# Patient Record
Sex: Female | Born: 1960 | Hispanic: No | Marital: Single
Health system: Southern US, Community
[De-identification: ages and names within clinical notes are randomized; demographics above are authoritative.]

---

## 1999-01-13 ENCOUNTER — Other Ambulatory Visit: Admission: RE | Admit: 1999-01-13 | Discharge: 1999-01-13 | Payer: Self-pay | Admitting: Gynecology

## 2000-02-24 ENCOUNTER — Other Ambulatory Visit: Admission: RE | Admit: 2000-02-24 | Discharge: 2000-02-24 | Payer: Self-pay | Admitting: Gynecology

## 2001-04-26 ENCOUNTER — Other Ambulatory Visit: Admission: RE | Admit: 2001-04-26 | Discharge: 2001-04-26 | Payer: Self-pay | Admitting: Gynecology

## 2002-06-01 ENCOUNTER — Other Ambulatory Visit: Admission: RE | Admit: 2002-06-01 | Discharge: 2002-06-01 | Payer: Self-pay | Admitting: Gynecology

## 2003-06-11 ENCOUNTER — Other Ambulatory Visit: Admission: RE | Admit: 2003-06-11 | Discharge: 2003-06-11 | Payer: Self-pay | Admitting: Gynecology

## 2004-06-02 ENCOUNTER — Other Ambulatory Visit: Admission: RE | Admit: 2004-06-02 | Discharge: 2004-06-02 | Payer: Self-pay | Admitting: Gynecology

## 2005-06-16 ENCOUNTER — Other Ambulatory Visit: Admission: RE | Admit: 2005-06-16 | Discharge: 2005-06-16 | Payer: Self-pay | Admitting: Gynecology

## 2006-06-15 ENCOUNTER — Other Ambulatory Visit: Admission: RE | Admit: 2006-06-15 | Discharge: 2006-06-15 | Payer: Self-pay | Admitting: Gynecology

## 2007-12-01 ENCOUNTER — Other Ambulatory Visit: Admission: RE | Admit: 2007-12-01 | Discharge: 2007-12-01 | Payer: Self-pay | Admitting: Gynecology

## 2011-05-21 ENCOUNTER — Other Ambulatory Visit: Payer: Self-pay | Admitting: Gynecology

## 2011-05-21 DIAGNOSIS — R928 Other abnormal and inconclusive findings on diagnostic imaging of breast: Secondary | ICD-10-CM

## 2011-05-29 ENCOUNTER — Ambulatory Visit
Admission: RE | Admit: 2011-05-29 | Discharge: 2011-05-29 | Disposition: A | Discharge status: Home / Self Care | Payer: 59 | Source: Ambulatory Visit | Attending: Gynecology | Admitting: Gynecology

## 2011-05-29 DIAGNOSIS — R928 Other abnormal and inconclusive findings on diagnostic imaging of breast: Secondary | ICD-10-CM

## 2011-12-16 ENCOUNTER — Other Ambulatory Visit: Payer: Self-pay | Admitting: Gynecology

## 2011-12-16 DIAGNOSIS — N63 Unspecified lump in unspecified breast: Secondary | ICD-10-CM

## 2011-12-25 ENCOUNTER — Ambulatory Visit
Admission: RE | Admit: 2011-12-25 | Discharge: 2011-12-25 | Disposition: A | Discharge status: Home / Self Care | Payer: 59 | Source: Ambulatory Visit | Attending: Gynecology | Admitting: Gynecology

## 2011-12-25 ENCOUNTER — Other Ambulatory Visit: Payer: Self-pay | Admitting: Gynecology

## 2011-12-25 DIAGNOSIS — N63 Unspecified lump in unspecified breast: Secondary | ICD-10-CM

## 2012-07-18 ENCOUNTER — Other Ambulatory Visit: Payer: Self-pay | Admitting: Gynecology

## 2012-07-18 DIAGNOSIS — N63 Unspecified lump in unspecified breast: Secondary | ICD-10-CM

## 2012-08-22 ENCOUNTER — Ambulatory Visit
Admission: RE | Admit: 2012-08-22 | Discharge: 2012-08-22 | Disposition: A | Discharge status: Home / Self Care | Payer: 59 | Source: Ambulatory Visit | Attending: Gynecology | Admitting: Gynecology

## 2012-08-22 DIAGNOSIS — N63 Unspecified lump in unspecified breast: Secondary | ICD-10-CM

## 2013-10-31 ENCOUNTER — Other Ambulatory Visit: Payer: Self-pay | Admitting: Obstetrics & Gynecology

## 2013-10-31 DIAGNOSIS — N63 Unspecified lump in unspecified breast: Secondary | ICD-10-CM

## 2013-11-17 ENCOUNTER — Other Ambulatory Visit: Payer: 59

## 2013-12-01 ENCOUNTER — Other Ambulatory Visit: Payer: 59

## 2013-12-11 ENCOUNTER — Other Ambulatory Visit: Payer: 59

## 2013-12-12 ENCOUNTER — Other Ambulatory Visit: Payer: 59

## 2013-12-25 ENCOUNTER — Ambulatory Visit
Admission: RE | Admit: 2013-12-25 | Discharge: 2013-12-25 | Disposition: A | Discharge status: Home / Self Care | Payer: Commercial Managed Care - PPO | Source: Ambulatory Visit | Attending: Obstetrics & Gynecology | Admitting: Obstetrics & Gynecology

## 2013-12-25 DIAGNOSIS — N63 Unspecified lump in unspecified breast: Secondary | ICD-10-CM

## 2015-09-06 ENCOUNTER — Other Ambulatory Visit: Payer: Self-pay

## 2015-09-06 DIAGNOSIS — Z1231 Encounter for screening mammogram for malignant neoplasm of breast: Secondary | ICD-10-CM

## 2015-10-03 ENCOUNTER — Ambulatory Visit: Payer: Commercial Managed Care - PPO

## 2015-11-13 ENCOUNTER — Ambulatory Visit
Admission: RE | Admit: 2015-11-13 | Discharge: 2015-11-13 | Disposition: A | Discharge status: Home / Self Care | Payer: Commercial Managed Care - PPO | Source: Ambulatory Visit

## 2015-11-13 DIAGNOSIS — Z1231 Encounter for screening mammogram for malignant neoplasm of breast: Secondary | ICD-10-CM

## 2017-08-23 ENCOUNTER — Other Ambulatory Visit: Payer: Self-pay | Admitting: Obstetrics & Gynecology

## 2017-08-23 ENCOUNTER — Other Ambulatory Visit: Payer: Self-pay | Admitting: Family Medicine

## 2017-08-23 DIAGNOSIS — Z1231 Encounter for screening mammogram for malignant neoplasm of breast: Secondary | ICD-10-CM

## 2017-10-04 ENCOUNTER — Ambulatory Visit: Payer: Commercial Managed Care - PPO

## 2017-11-08 ENCOUNTER — Ambulatory Visit: Payer: Commercial Managed Care - PPO

## 2017-11-22 ENCOUNTER — Ambulatory Visit
Admission: RE | Admit: 2017-11-22 | Discharge: 2017-11-22 | Disposition: A | Discharge status: Home / Self Care | Payer: Commercial Managed Care - PPO | Source: Ambulatory Visit | Attending: Family Medicine | Admitting: Family Medicine

## 2017-11-22 DIAGNOSIS — Z1231 Encounter for screening mammogram for malignant neoplasm of breast: Secondary | ICD-10-CM

## 2019-12-11 ENCOUNTER — Other Ambulatory Visit: Payer: Self-pay | Admitting: Family Medicine

## 2019-12-11 DIAGNOSIS — Z1231 Encounter for screening mammogram for malignant neoplasm of breast: Secondary | ICD-10-CM

## 2020-01-17 ENCOUNTER — Ambulatory Visit
Admission: RE | Admit: 2020-01-17 | Discharge: 2020-01-17 | Disposition: A | Discharge status: Home / Self Care | Payer: Commercial Managed Care - PPO | Source: Ambulatory Visit | Attending: Family Medicine | Admitting: Family Medicine

## 2020-01-17 ENCOUNTER — Other Ambulatory Visit: Payer: Self-pay

## 2020-01-17 DIAGNOSIS — Z1231 Encounter for screening mammogram for malignant neoplasm of breast: Secondary | ICD-10-CM

## 2021-10-01 ENCOUNTER — Other Ambulatory Visit: Payer: Self-pay | Admitting: Family Medicine

## 2021-10-01 DIAGNOSIS — Z1231 Encounter for screening mammogram for malignant neoplasm of breast: Secondary | ICD-10-CM

## 2021-11-07 ENCOUNTER — Ambulatory Visit
Admission: RE | Admit: 2021-11-07 | Discharge: 2021-11-07 | Disposition: A | Discharge status: Home / Self Care | Payer: Commercial Managed Care - PPO | Source: Ambulatory Visit | Attending: Family Medicine | Admitting: Family Medicine

## 2021-11-07 DIAGNOSIS — Z1231 Encounter for screening mammogram for malignant neoplasm of breast: Secondary | ICD-10-CM

## 2022-02-28 IMAGING — MG MM DIGITAL SCREENING BILAT W/ TOMO AND CAD
8 series · 9 of 24 positions shown · non-contrast
Comparison: Previous exam(s).

CLINICAL DATA: Screening.

EXAM:
DIGITAL SCREENING BILATERAL MAMMOGRAM WITH TOMOSYNTHESIS AND CAD
TECHNIQUE: Bilateral screening digital craniocaudal and mediolateral oblique
mammograms were obtained. Bilateral screening digital breast
tomosynthesis was performed. The images were evaluated with
computer-aided detection.

[L CC synth-2D]
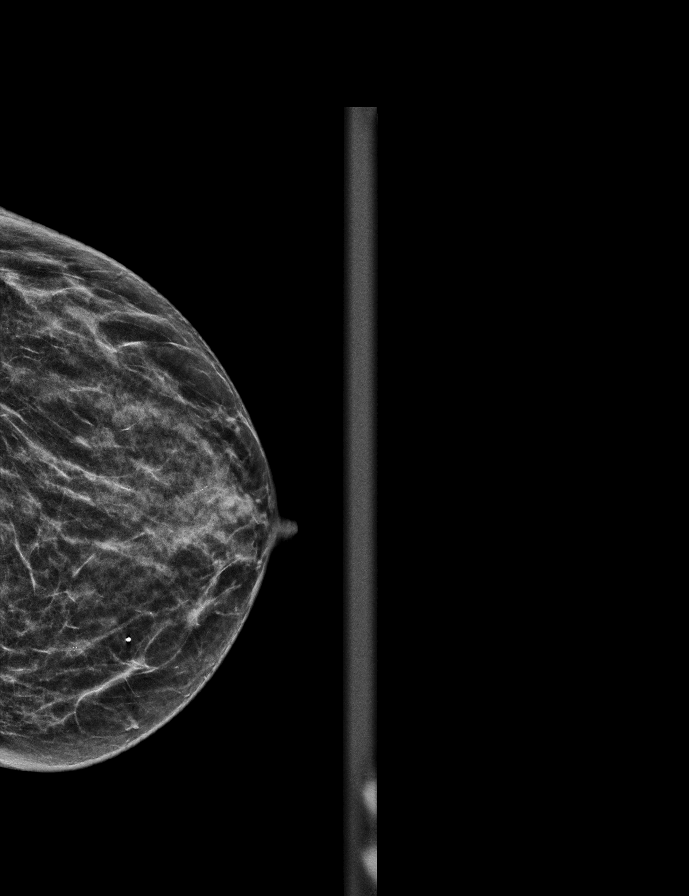

[R MLO synth-2D]
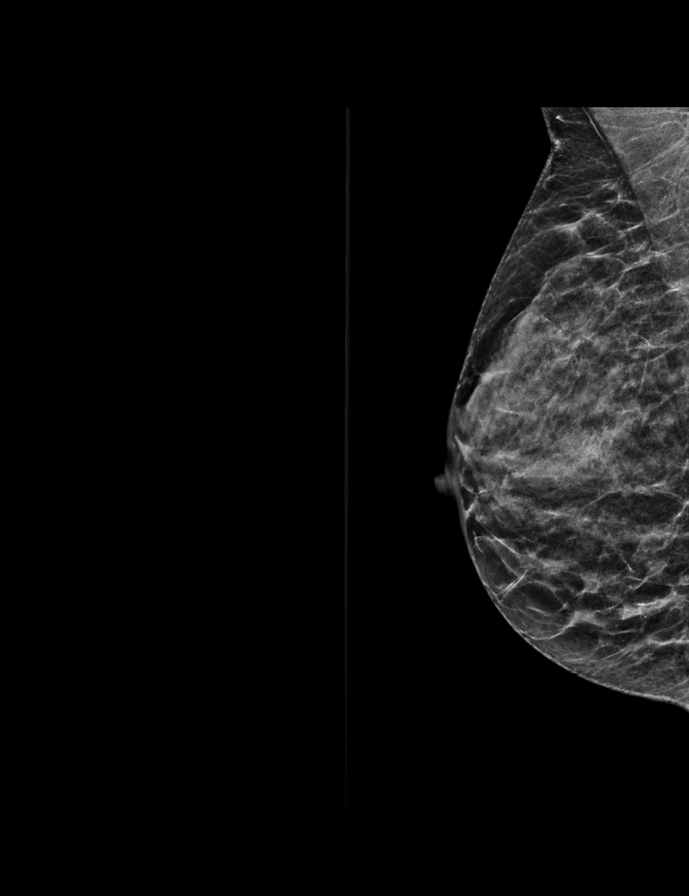

[L MLO synth-2D]
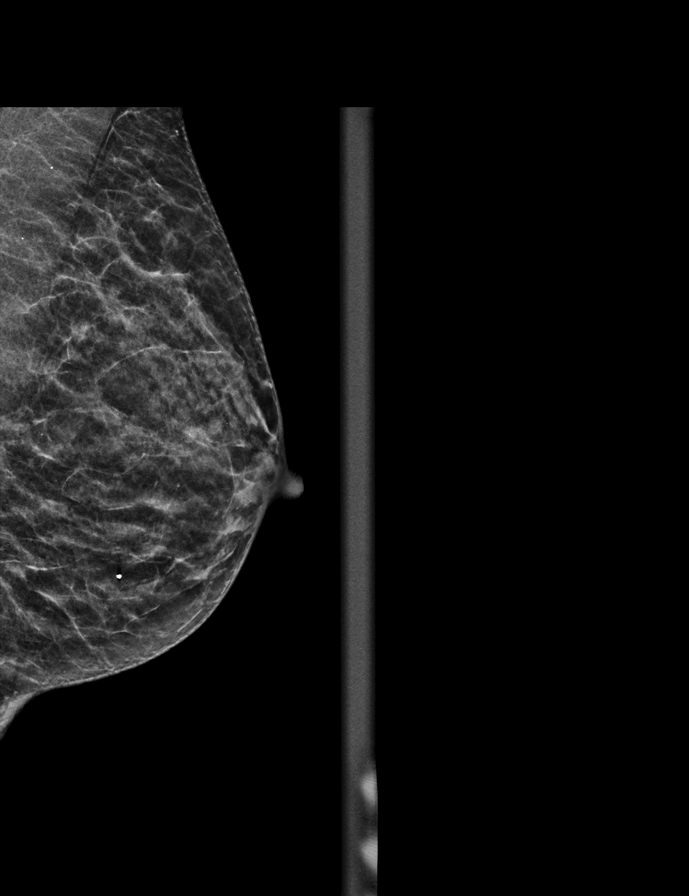

[R CC synth-2D]
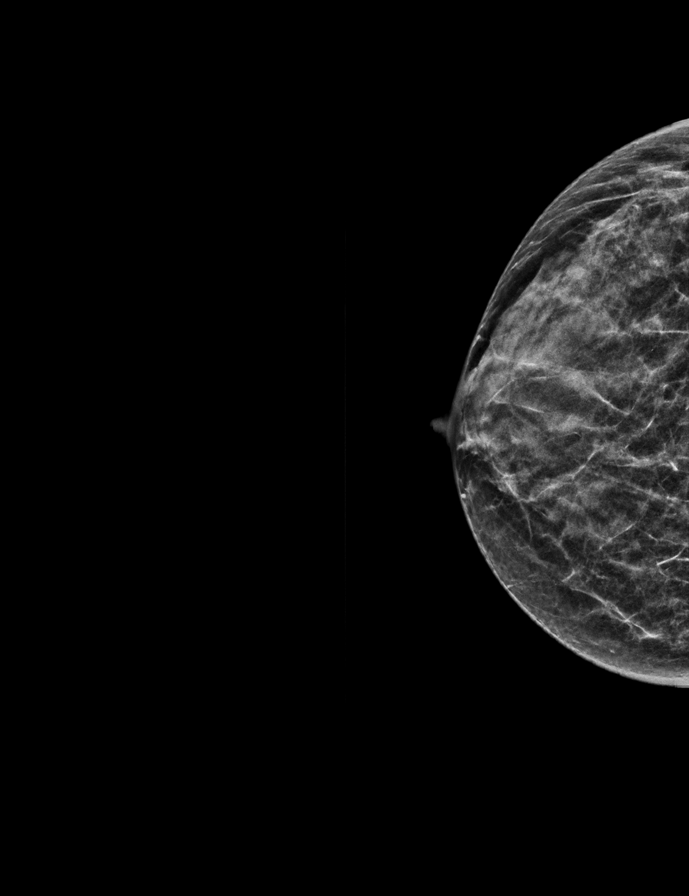

[L CC tomo · 2 of 39 frames shown]
[frame 13/39]
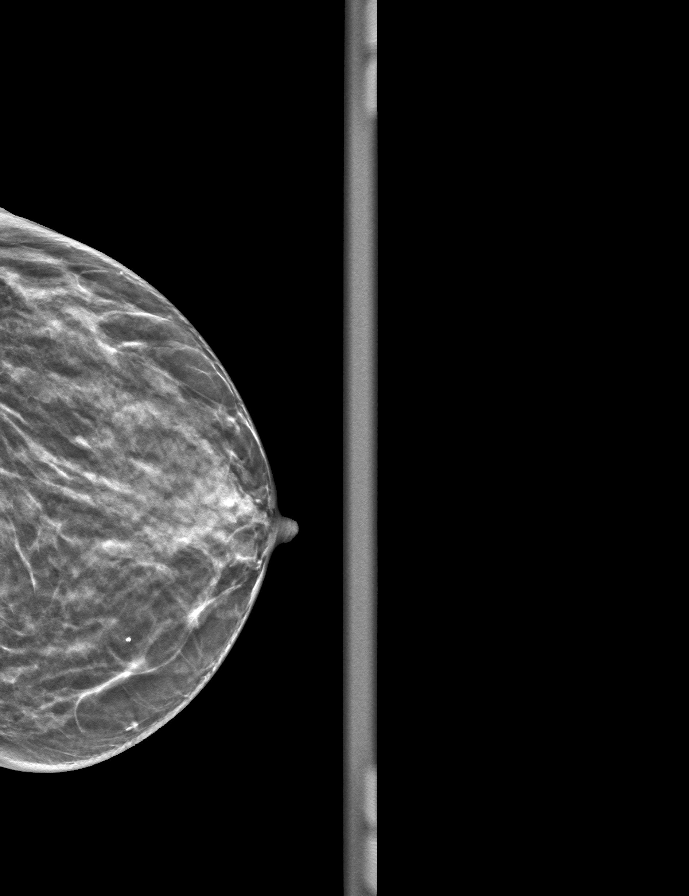
[frame 20/39]
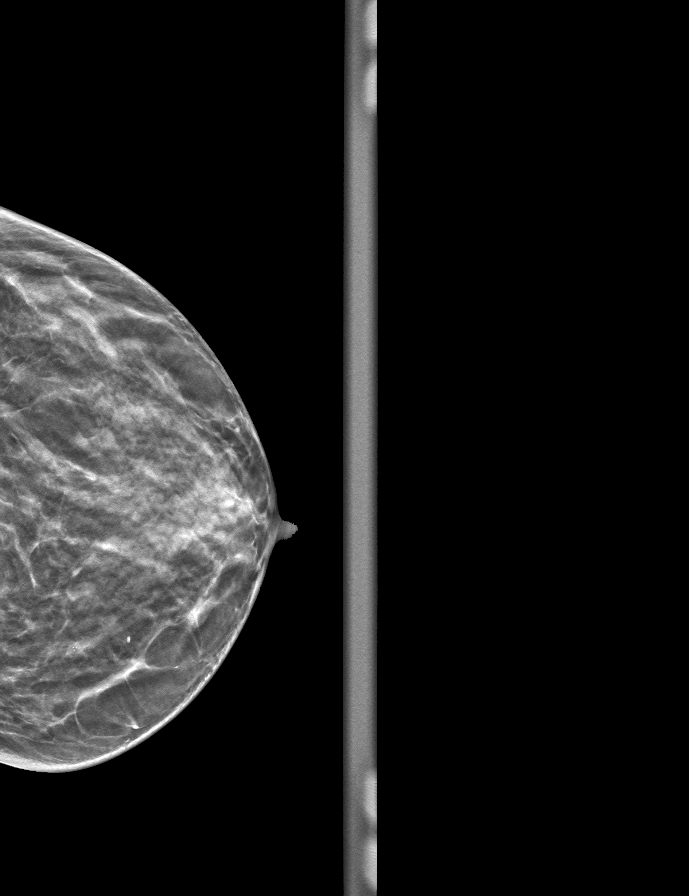

[L MLO tomo · tomo slice 19/36.0]
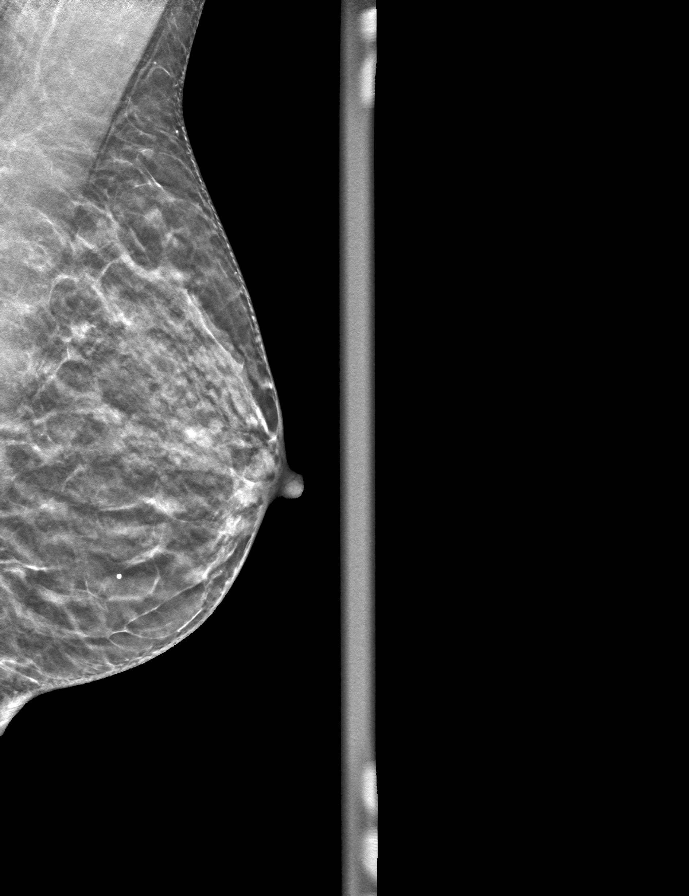

[R MLO tomo · tomo slice 19/37.0]
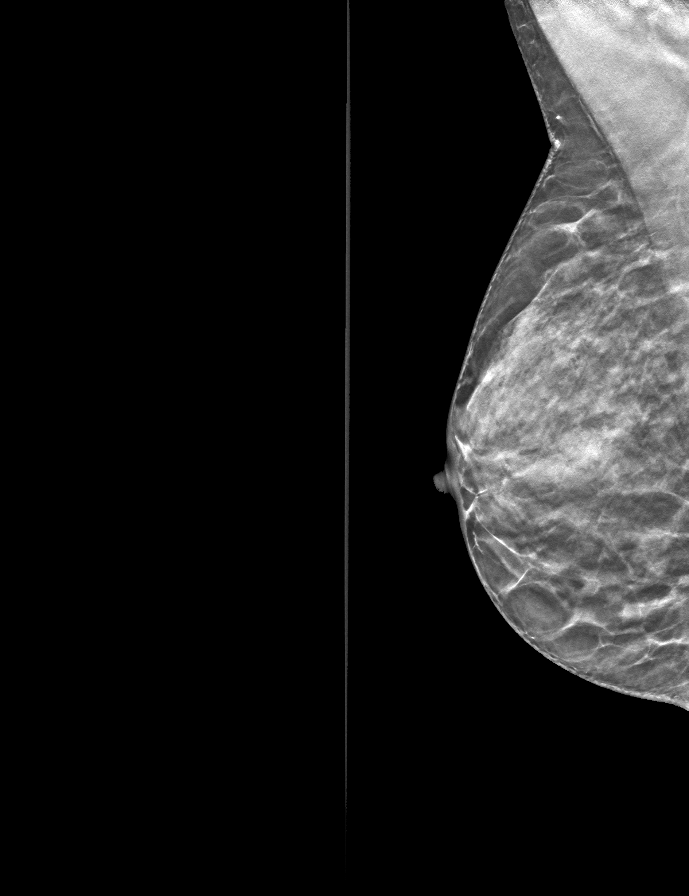

[R CC tomo · tomo slice 19/38.0]
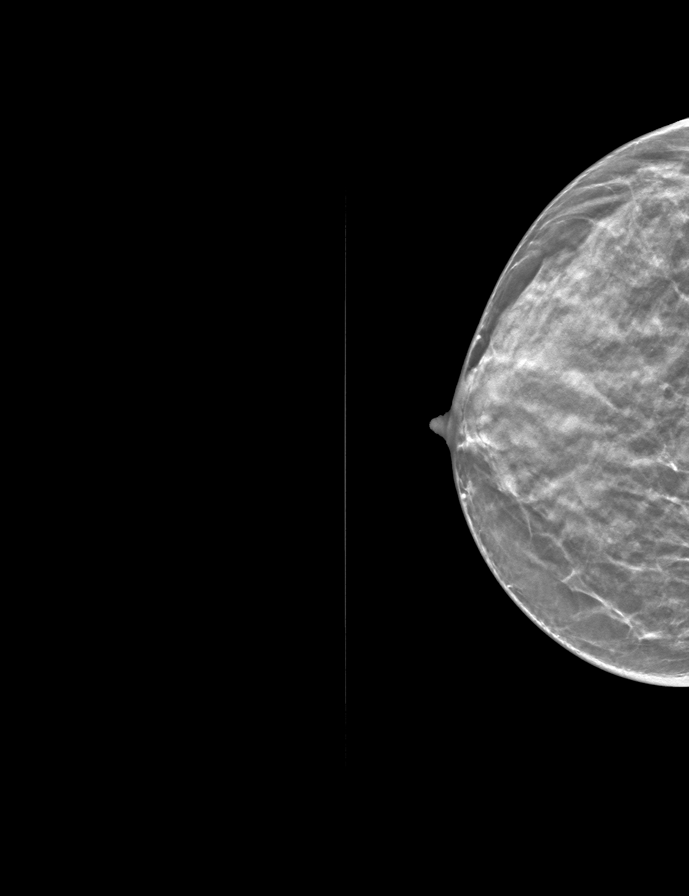

[9 of 24 positions shown; findings below may reference images not displayed]

ACR Breast Density Category d: The breast tissue is extremely dense,
which lowers the sensitivity of mammography
FINDINGS: There are no findings suspicious for malignancy.
IMPRESSION: No mammographic evidence of malignancy. A result letter of this
screening mammogram will be mailed directly to the patient.

RECOMMENDATION:
Screening mammogram in one year. (Code:[id number])

BI-RADS CATEGORY  1: Negative.

## 2022-10-13 ENCOUNTER — Other Ambulatory Visit: Payer: Self-pay | Admitting: Family Medicine

## 2022-10-13 DIAGNOSIS — Z1231 Encounter for screening mammogram for malignant neoplasm of breast: Secondary | ICD-10-CM

## 2022-12-28 ENCOUNTER — Ambulatory Visit
Admission: RE | Admit: 2022-12-28 | Discharge: 2022-12-28 | Disposition: A | Discharge status: Home / Self Care | Payer: Commercial Managed Care - PPO | Source: Ambulatory Visit | Attending: Family Medicine | Admitting: Family Medicine

## 2022-12-28 DIAGNOSIS — Z1231 Encounter for screening mammogram for malignant neoplasm of breast: Secondary | ICD-10-CM

## 2023-10-26 ENCOUNTER — Other Ambulatory Visit: Payer: Self-pay | Admitting: Family Medicine

## 2023-10-26 DIAGNOSIS — Z1231 Encounter for screening mammogram for malignant neoplasm of breast: Secondary | ICD-10-CM

## 2023-12-31 ENCOUNTER — Ambulatory Visit: Payer: Commercial Managed Care - PPO

## 2024-01-14 ENCOUNTER — Ambulatory Visit
Admission: RE | Admit: 2024-01-14 | Discharge: 2024-01-14 | Disposition: A | Discharge status: Home / Self Care | Payer: Commercial Managed Care - PPO | Source: Ambulatory Visit | Attending: Family Medicine | Admitting: Family Medicine

## 2024-01-14 DIAGNOSIS — Z1231 Encounter for screening mammogram for malignant neoplasm of breast: Secondary | ICD-10-CM

## 2024-11-06 ENCOUNTER — Other Ambulatory Visit: Payer: Self-pay

## 2024-11-06 DIAGNOSIS — Z1231 Encounter for screening mammogram for malignant neoplasm of breast: Secondary | ICD-10-CM

## 2025-01-19 ENCOUNTER — Ambulatory Visit
